# Patient Record
Sex: Female | Born: 1935 | Race: White | Hispanic: No | Smoking: Never smoker
Health system: Southern US, Community
[De-identification: ages and names within clinical notes are randomized; demographics above are authoritative.]

## PROBLEM LIST (undated history)

## (undated) DIAGNOSIS — E538 Deficiency of other specified B group vitamins: Secondary | ICD-10-CM

## (undated) DIAGNOSIS — I8390 Asymptomatic varicose veins of unspecified lower extremity: Secondary | ICD-10-CM

## (undated) DIAGNOSIS — E039 Hypothyroidism, unspecified: Secondary | ICD-10-CM

## (undated) DIAGNOSIS — M8580 Other specified disorders of bone density and structure, unspecified site: Secondary | ICD-10-CM

## (undated) DIAGNOSIS — M2010 Hallux valgus (acquired), unspecified foot: Secondary | ICD-10-CM

## (undated) DIAGNOSIS — G309 Alzheimer's disease, unspecified: Secondary | ICD-10-CM

## (undated) DIAGNOSIS — R809 Proteinuria, unspecified: Secondary | ICD-10-CM

## (undated) DIAGNOSIS — E7800 Pure hypercholesterolemia, unspecified: Secondary | ICD-10-CM

## (undated) DIAGNOSIS — E559 Vitamin D deficiency, unspecified: Secondary | ICD-10-CM

## (undated) DIAGNOSIS — I10 Essential (primary) hypertension: Secondary | ICD-10-CM

## (undated) DIAGNOSIS — F0280 Dementia in other diseases classified elsewhere without behavioral disturbance: Secondary | ICD-10-CM

## (undated) HISTORY — PX: CATARACT EXTRACTION W/ INTRAOCULAR LENS  IMPLANT, BILATERAL: SHX1307

## (undated) HISTORY — DX: Vitamin D deficiency, unspecified: E55.9

## (undated) HISTORY — DX: Dementia in other diseases classified elsewhere, unspecified severity, without behavioral disturbance, psychotic disturbance, mood disturbance, and anxiety: F02.80

## (undated) HISTORY — DX: Alzheimer's disease, unspecified: G30.9

## (undated) HISTORY — DX: Asymptomatic varicose veins of unspecified lower extremity: I83.90

## (undated) HISTORY — PX: BASAL CELL CARCINOMA EXCISION: SHX1214

## (undated) HISTORY — DX: Other specified disorders of bone density and structure, unspecified site: M85.80

## (undated) HISTORY — DX: Proteinuria, unspecified: R80.9

## (undated) HISTORY — DX: Deficiency of other specified B group vitamins: E53.8

## (undated) HISTORY — DX: Pure hypercholesterolemia, unspecified: E78.00

## (undated) HISTORY — DX: Hallux valgus (acquired), unspecified foot: M20.10

## (undated) HISTORY — DX: Hypothyroidism, unspecified: E03.9

## (undated) HISTORY — DX: Essential (primary) hypertension: I10

---
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
## 1983-04-04 DIAGNOSIS — I10 Essential (primary) hypertension: Secondary | ICD-10-CM

## 1983-04-04 HISTORY — DX: Essential (primary) hypertension: I10

## 1999-04-11 ENCOUNTER — Other Ambulatory Visit: Admission: RE | Admit: 1999-04-11 | Discharge: 1999-04-11 | Payer: Self-pay | Admitting: Geriatric Medicine

## 2000-04-23 ENCOUNTER — Ambulatory Visit (HOSPITAL_COMMUNITY): Admission: RE | Admit: 2000-04-23 | Discharge: 2000-04-23 | Payer: Self-pay | Admitting: Specialist

## 2000-05-14 ENCOUNTER — Ambulatory Visit (HOSPITAL_BASED_OUTPATIENT_CLINIC_OR_DEPARTMENT_OTHER): Admission: RE | Admit: 2000-05-14 | Discharge: 2000-05-14 | Payer: Self-pay | Admitting: Plastic Surgery

## 2000-05-14 ENCOUNTER — Encounter (INDEPENDENT_AMBULATORY_CARE_PROVIDER_SITE_OTHER): Payer: Self-pay | Admitting: *Deleted

## 2000-05-23 ENCOUNTER — Ambulatory Visit (HOSPITAL_COMMUNITY): Admission: RE | Admit: 2000-05-23 | Discharge: 2000-05-23 | Payer: Self-pay | Admitting: Specialist

## 2000-08-09 ENCOUNTER — Emergency Department (HOSPITAL_COMMUNITY): Admission: EM | Admit: 2000-08-09 | Discharge: 2000-08-09 | Payer: Self-pay

## 2000-10-03 ENCOUNTER — Encounter: Admission: RE | Admit: 2000-10-03 | Discharge: 2000-10-03 | Payer: Self-pay | Admitting: Geriatric Medicine

## 2000-10-03 ENCOUNTER — Encounter: Payer: Self-pay | Admitting: Geriatric Medicine

## 2002-02-03 ENCOUNTER — Other Ambulatory Visit: Admission: RE | Admit: 2002-02-03 | Discharge: 2002-02-03 | Payer: Self-pay | Admitting: Geriatric Medicine

## 2006-02-19 ENCOUNTER — Encounter: Admission: RE | Admit: 2006-02-19 | Discharge: 2006-02-19 | Payer: Self-pay | Admitting: Geriatric Medicine

## 2008-04-14 ENCOUNTER — Encounter: Admission: RE | Admit: 2008-04-14 | Discharge: 2008-04-14 | Payer: Self-pay | Admitting: Geriatric Medicine

## 2009-10-17 ENCOUNTER — Emergency Department (HOSPITAL_COMMUNITY): Admission: EM | Admit: 2009-10-17 | Discharge: 2009-10-17 | Payer: Self-pay | Admitting: Emergency Medicine

## 2010-04-01 ENCOUNTER — Inpatient Hospital Stay (HOSPITAL_COMMUNITY)
Admission: EM | Admit: 2010-04-01 | Discharge: 2010-04-04 | Payer: Self-pay | Source: Home / Self Care | Attending: Internal Medicine | Admitting: Internal Medicine

## 2010-04-24 ENCOUNTER — Encounter: Payer: Self-pay | Admitting: Geriatric Medicine

## 2010-06-13 LAB — BASIC METABOLIC PANEL
BUN: 11 mg/dL (ref 6–23)
CO2: 27 mEq/L (ref 19–32)
Chloride: 105 mEq/L (ref 96–112)
GFR calc Af Amer: >60 mL/min (ref >60)
GFR calc non Af Amer: >60 mL/min (ref >60)
Glucose, Bld: 128 mg/dL — ABNORMAL HIGH (ref 70–99)
Potassium: 4.3 mEq/L (ref 3.5–5.1)

## 2010-06-13 LAB — CBC
HCT: 37.4 % (ref 36.0–46.0)
MCHC: 32.6 g/dL (ref 30.0–36.0)
Platelets: 167 10*3/uL (ref 150–400)
RBC: 4.01 MIL/uL (ref 3.87–5.11)

## 2010-06-13 LAB — DIFFERENTIAL
Basophils Absolute: 0 10*3/uL (ref 0.0–0.1)
Eosinophils Relative: 0 % (ref 0–5)
Lymphs Abs: 0.6 10*3/uL — ABNORMAL LOW (ref 0.7–4.0)
Monocytes Absolute: 0.4 10*3/uL (ref 0.1–1.0)
Monocytes Relative: 4 % (ref 3–12)
Neutro Abs: 10 10*3/uL — ABNORMAL HIGH (ref 1.7–7.7)

## 2010-06-13 LAB — URINALYSIS, ROUTINE W REFLEX MICROSCOPIC
Bilirubin Urine: NEGATIVE
Glucose, UA: NEGATIVE mg/dL
Ketones, ur: >80 mg/dL — AB
Protein, ur: NEGATIVE mg/dL
Specific Gravity, Urine: 1.024 (ref 1.005–1.030)
Urobilinogen, UA: 0.2 mg/dL (ref 0.0–1.0)

## 2010-06-13 LAB — CARDIAC PANEL(CRET KIN+CKTOT+MB+TROPI)
CK, MB: 1.3 ng/mL (ref 0.3–4.0)
CK, MB: 1.5 ng/mL (ref 0.3–4.0)
Relative Index: INVALID (ref 0.0–2.5)
Total CK: 40 U/L (ref 7–177)
Total CK: 45 U/L (ref 7–177)
Troponin I: 0.01 ng/mL (ref 0.00–0.06)

## 2010-06-13 LAB — HEPATIC FUNCTION PANEL
Albumin: 3.8 g/dL (ref 3.5–5.2)
Bilirubin, Direct: 0.2 mg/dL (ref 0.0–0.3)

## 2010-06-13 LAB — RAPID URINE DRUG SCREEN, HOSP PERFORMED
Barbiturates: NOT DETECTED
Benzodiazepines: NOT DETECTED

## 2010-06-13 LAB — LIPASE, BLOOD: Lipase: 24 U/L (ref 11–59)

## 2010-06-13 LAB — ETHANOL: Alcohol, Ethyl (B): <5 mg/dL (ref 0–10)

## 2010-06-13 LAB — TSH: TSH: 0.123 u[IU]/mL — ABNORMAL LOW (ref 0.350–4.500)

## 2010-06-13 LAB — LACTIC ACID, PLASMA: Lactic Acid, Venous: 1 mmol/L (ref 0.5–2.2)

## 2010-08-19 NOTE — Op Note (Signed)
[place]  Patient:    Autumn Washington, Autumn Washington                      MRN: [id number] Proc. Date: 05/14/00 Adm. Date:  [id number] Disc. Date: [id number] Attending:  Mick Sell                           Operative Report  PREOPERATIVE DIAGNOSES: 1. Basal cell carcinoma, right lower leg, greater than 1.0 cm. 2. Separate basal cell carcinoma of the medial aspect of the right lower    leg, approximately 0.9 cm.  POSTOPERATIVE DIAGNOSES: 1. Basal cell carcinoma, right lower leg, greater than 1.0 cm. 2. Separate basal cell carcinoma of the medial aspect of the right lower    leg, approximately 0.9 cm. 3. Complex open wound on the lower leg resulting from carcinoma excision with    total length of 4.5 cm.  PROCEDURES: 1. Excision of basal cell carcinoma, right lower leg lateral aspect, greater    than 1.0 cm. 2. Excision of basal cell carcinoma, medial aspect right leg, 0.9 cm. 3. Complex wound closure right lower leg, 4.5 cm total length of the two    wounds.  SURGEON:  Teena Irani. Odis Luster, M.D.  ANESTHESIA:  1% Xylocaine with epinephrine plus bicarbonate.  CLINICAL NOTE:  A 77 year old woman with biopsy-proven basal cell carcinoma on the right lower leg, one laterally, one medially.  The one laterally is larger.  These have been biopsied by her dermatologist.  She is referred to plastic surgery for the wide excision of this area.  This nature of this procedure and the risks were discussed with her in great detail.  She understood the possibility of further study if negative margins were not obtained on the final pathology specimen.  She wished to proceed.  She also understood that there was significant risk of wound healing problems on the lower extremity.  DESCRIPTION OF PROCEDURE:  Patient taken to the operating room and placed supine.  The elliptical excisions were then marked, orienting the ellipse with the maximum diameter of the lesion.  A  margin of grossly normal tissue was taken around the lesions.  She was prepped with Betadine and draped with sterile drapes.  Satisfactory local anesthesia was achieved.  Elliptical excisions were performed, changing the scalpel blade in between the two excisions.  Small bleeding vessel in the wound bed on the lateral aspect was suture ligated with a 3-0 Monocryl suture.  A thorough irrigation and hemostasis having been noted, a layered closure was performed using 3-0 Monocryl interrupted inverted deep sutures and deep dermal sutures and running 3-0 Monocryl subcuticular suture.  This achieved very nice closures in both wounds, and Steri-Strips were applied.  A dry sterile dressing was applied with a Kerlix wrap and then a four-inch Ace wrap from the toes up to the top of the calf for support.  She tolerated this procedure well.  DISPOSITION:  Will see her back in the office next week for recheck.  She is to elevate her leg as much as possible for the next three days.  She may remove the dressing and the Ace wrap three days from now and may shower, leaving the Steri-Strips in place.  She will take Tylenol for pain. DD:  05/14/00 TD:  05/14/00 Job: [id number]

## 2012-08-16 IMAGING — CR DG CHEST 2V
2 series · 2 of 2 positions shown · non-contrast
Comparison: 04/01/2010

CLINICAL DATA: Dehydration.  Gastroenteritis.  Confusion.

CHEST - 2 VIEW

[w chest pa]
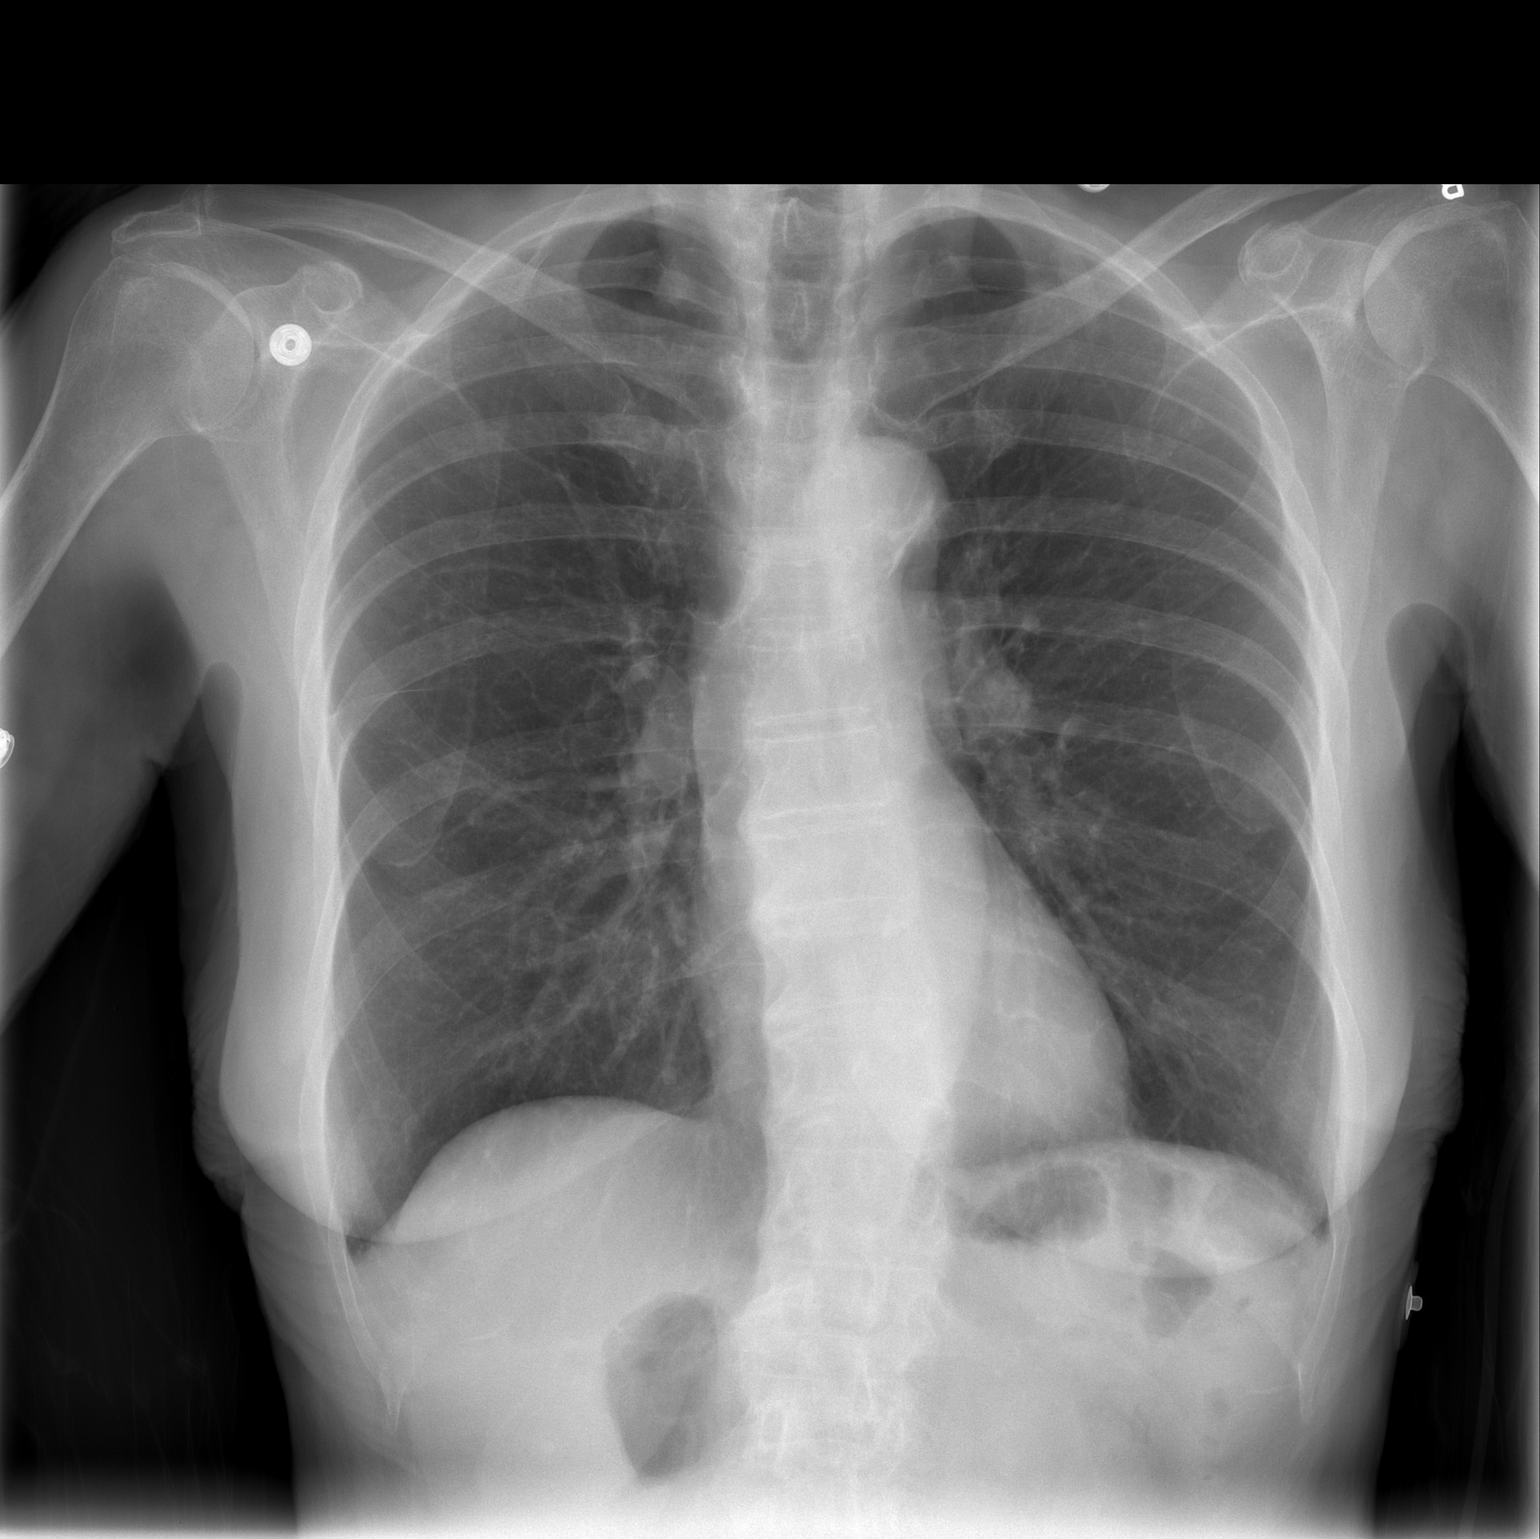

[w chest lat]
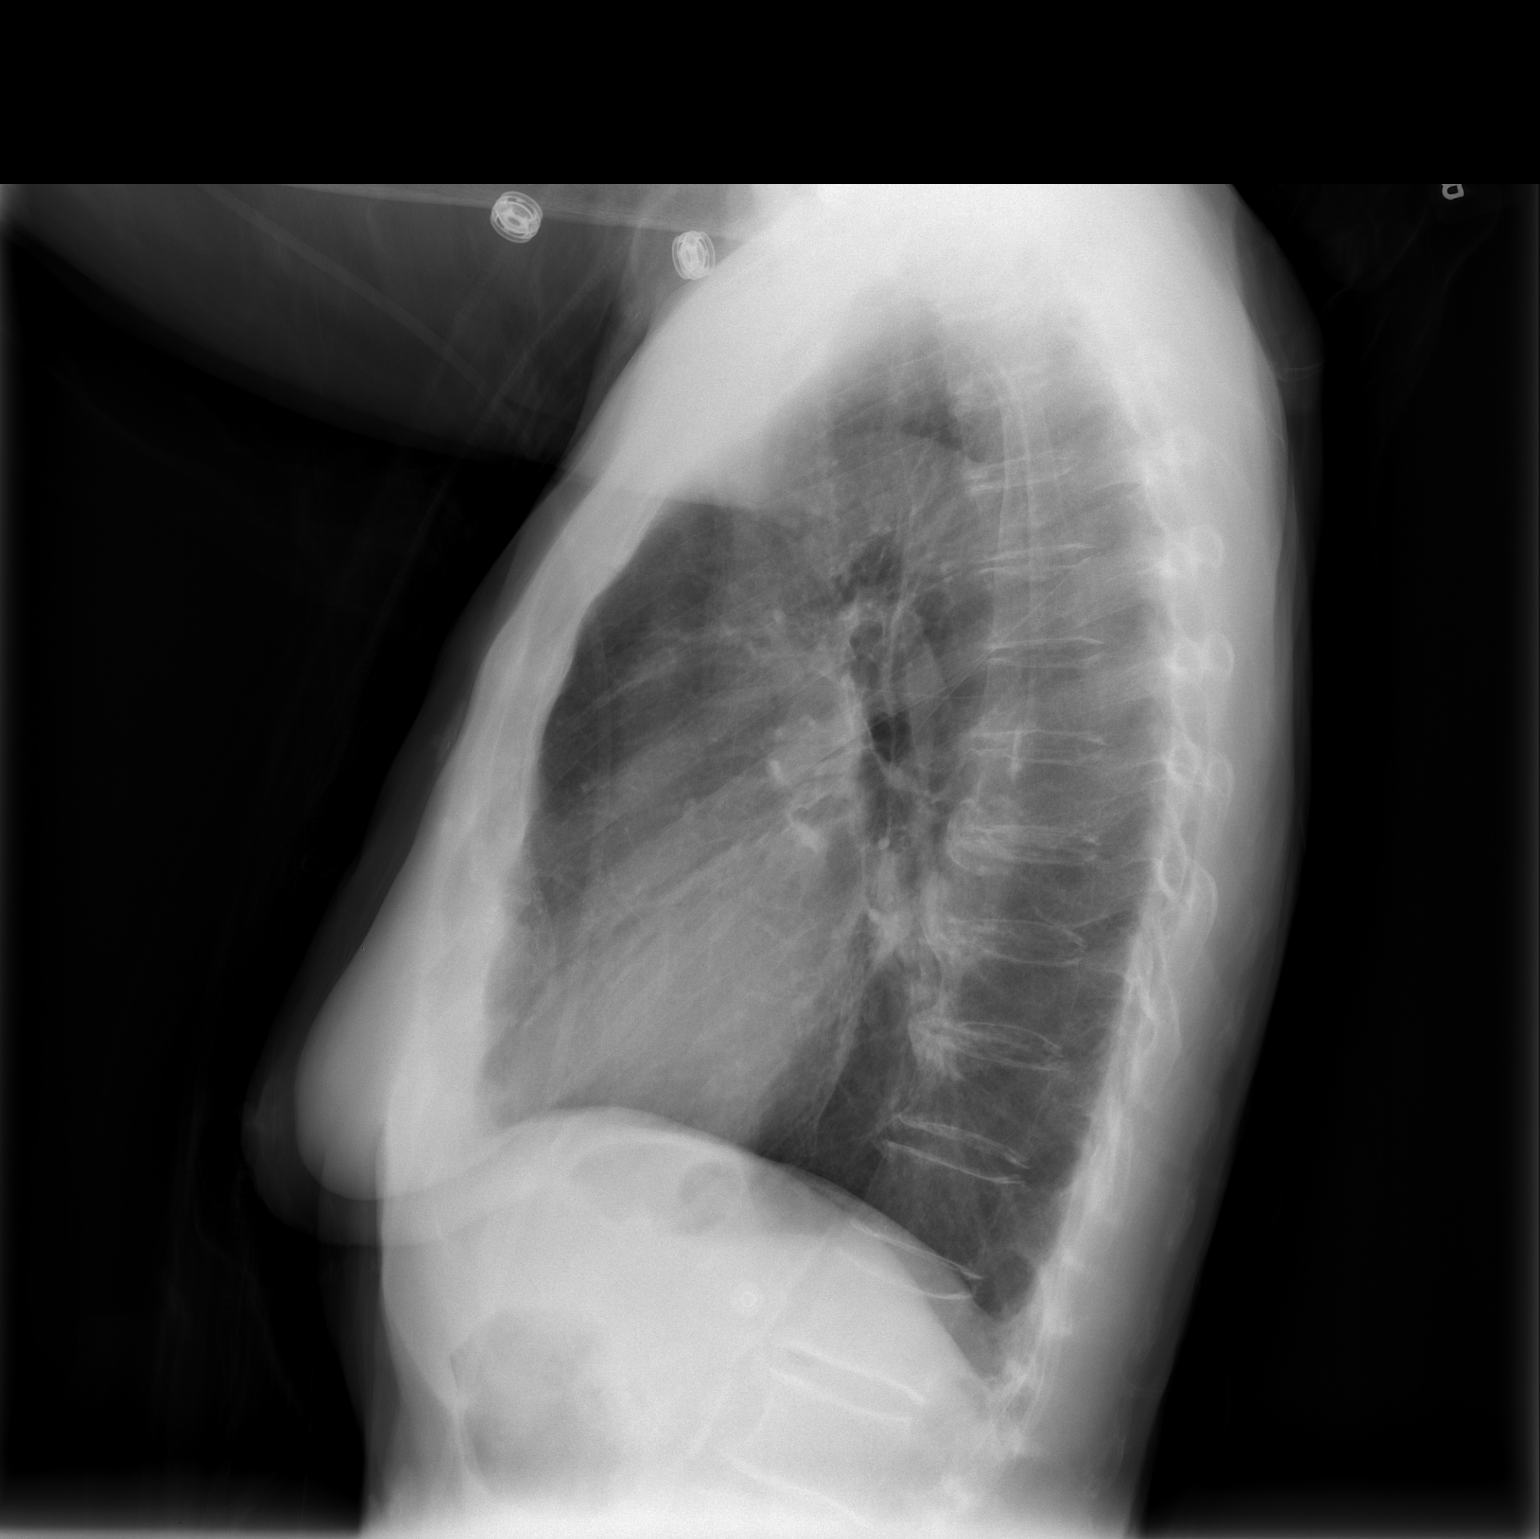

[2 of 2 positions shown; findings below may reference images not displayed]

FINDINGS: Lower thoracic spondylosis is moderate.  S-shaped spinal
curvature is mild. Midline trachea.  Normal heart size and
mediastinal contours for age.  No pleural effusion or pneumothorax.
Increased density projecting over the posterior 3rd right rib
medially felt to represent summation shadow of ribs and transverse
process.  The lungs are otherwise clear.
IMPRESSION: No acute cardiopulmonary disease.

## 2013-12-24 ENCOUNTER — Other Ambulatory Visit (HOSPITAL_COMMUNITY): Payer: Self-pay | Admitting: Cardiology

## 2013-12-24 ENCOUNTER — Ambulatory Visit (HOSPITAL_COMMUNITY)
Admission: RE | Admit: 2013-12-24 | Discharge: 2013-12-24 | Disposition: A | Discharge status: Home / Self Care | Payer: Medicare Other | Source: Ambulatory Visit | Attending: Vascular Surgery | Admitting: Vascular Surgery

## 2013-12-24 DIAGNOSIS — I872 Venous insufficiency (chronic) (peripheral): Secondary | ICD-10-CM

## 2013-12-24 DIAGNOSIS — R609 Edema, unspecified: Secondary | ICD-10-CM | POA: Diagnosis not present

## 2013-12-24 DIAGNOSIS — M7989 Other specified soft tissue disorders: Secondary | ICD-10-CM

## 2013-12-24 DIAGNOSIS — L539 Erythematous condition, unspecified: Secondary | ICD-10-CM | POA: Diagnosis not present

## 2013-12-24 DIAGNOSIS — M79609 Pain in unspecified limb: Secondary | ICD-10-CM

## 2013-12-24 NOTE — Progress Notes (Signed)
VASCULAR LAB PRELIMINARY  PRELIMINARY  PRELIMINARY  PRELIMINARY  Bilateral lower extremity venous duplex completed.    Preliminary report:  Bilateral:  No evidence of DVT, superficial thrombosis, or Baker's Cyst.   Zorana [place], RVS 12/24/2013, 3:16 PM

## 2014-01-20 ENCOUNTER — Encounter (HOSPITAL_COMMUNITY): Payer: Self-pay | Admitting: Cardiology

## 2014-01-20 ENCOUNTER — Encounter: Payer: Self-pay | Admitting: *Deleted

## 2015-04-04 DEATH — deceased
